# Patient Record
Sex: Female | Born: 1985 | Race: Asian | Hispanic: No | Marital: Single | Smoking: Never smoker
Health system: Southern US, Community
[De-identification: ages and names within clinical notes are randomized; demographics above are authoritative.]

## PROBLEM LIST (undated history)

## (undated) DIAGNOSIS — Z789 Other specified health status: Secondary | ICD-10-CM

## (undated) HISTORY — PX: NO PAST SURGERIES: SHX2092

---

## 2011-05-25 ENCOUNTER — Emergency Department (HOSPITAL_COMMUNITY)
Admission: EM | Admit: 2011-05-25 | Discharge: 2011-05-25 | Disposition: A | Discharge status: Home / Self Care | Payer: BC Managed Care – PPO | Attending: Emergency Medicine | Admitting: Emergency Medicine

## 2011-05-25 ENCOUNTER — Encounter (HOSPITAL_COMMUNITY): Payer: Self-pay | Admitting: *Deleted

## 2011-05-25 ENCOUNTER — Emergency Department (HOSPITAL_COMMUNITY): Payer: BC Managed Care – PPO

## 2011-05-25 DIAGNOSIS — W260XXA Contact with knife, initial encounter: Secondary | ICD-10-CM | POA: Insufficient documentation

## 2011-05-25 DIAGNOSIS — S61209A Unspecified open wound of unspecified finger without damage to nail, initial encounter: Secondary | ICD-10-CM | POA: Insufficient documentation

## 2011-05-25 DIAGNOSIS — S61219A Laceration without foreign body of unspecified finger without damage to nail, initial encounter: Secondary | ICD-10-CM

## 2011-05-25 MED ORDER — TETANUS-DIPHTH-ACELL PERTUSSIS 5-2.5-18.5 LF-MCG/0.5 IM SUSP
0.5000 mL | Freq: Once | INTRAMUSCULAR | Status: AC
  Administered 2011-05-25: 0.5 mL via INTRAMUSCULAR
  Filled 2011-05-25: qty 0.5

## 2011-05-25 MED ORDER — LIDOCAINE HCL 2 % IJ SOLN
10.0000 mL | Freq: Once | INTRAMUSCULAR | Status: AC
  Administered 2011-05-25: 200 mg

## 2011-05-25 MED ORDER — HYDROCODONE-ACETAMINOPHEN 5-325 MG PO TABS: 1.0000 | ORAL_TABLET | ORAL | Status: AC | PRN

## 2011-05-25 NOTE — Discharge Instructions (Signed)
Fingertip Laceration  The treatment of fingertip injuries depends on how large the cut is and whether the bone or nail tissue has been damaged. Amputations of the skin over the tip of the finger that is smaller than a dime (smaller than 1cm) will usually heal very well from the sides without any treatment other than cleaning the wound and changing the dressing.  Keep your hand elevated for the next 2 to 3 days to reduce pain and swelling. A splint over the fingertip may be needed to protect your injury. If your cut is being allowed to heal in from the sides, you should soak it in warm water and change the dressing daily.   You may need a tetanus shot if:   You cannot remember when you had your last tetanus shot.   You have never had a tetanus shot.   The injury broke your skin.  If you got a tetanus shot, your arm may swell, get red, and feel warm to the touch. This is common and not a problem. If you need a tetanus shot and you choose not to have one, there is a rare chance of getting tetanus. Sickness from tetanus can be serious.  SEEK MEDICAL CARE IF:    There are any signs of infection: increased redness, swelling, and pain, or sometimes pus drainage.  Document Released: 03/06/2004 Document Revised: 01/16/2011 Document Reviewed: 03/02/2008  ExitCare Patient Information 2012 [employer].

## 2011-05-25 NOTE — ED Notes (Signed)
Pt report cut the tip of left thumb with a knife an hour ago. Bleeding controlled. Cut through part of nailbed.  Pt denies history of recent tetanus shot.  Pt reports pain to be 5/10

## 2011-05-25 NOTE — ED Provider Notes (Signed)
History     CSN: [id number]  Arrival date & time 05/25/11  1925   First MD Initiated Contact with Patient 05/25/11 2008      Chief Complaint  Patient presents with  . Finger Injury  . Laceration    (Consider location/radiation/quality/duration/timing/severity/associated sxs/prior treatment) HPI  Pt presents to teh ED with complaints of left thumb laceration. She was cooking dinner and accidentally cut herself with a knife. Bleeding is controlled at this time. She is unsure of her tetanus status. She denies any other injuries or bleeding disorder.  History reviewed. No pertinent past medical history.  History reviewed. No pertinent past surgical history.  No family history on file.  History  Substance Use Topics  . Smoking status: Never Smoker   . Smokeless tobacco: Not on file  . Alcohol Use: No    OB History    Grav Para Term Preterm Abortions TAB SAB Ect Mult Living                  Review of Systems  All other systems reviewed and are negative.    Allergies  Review of patient's allergies indicates no known allergies.  Home Medications   Current Outpatient Rx  Name Route Sig Dispense Refill  . HYDROCODONE-ACETAMINOPHEN 5-325 MG PO TABS Oral Take 1 tablet by mouth every 4 (four) hours as needed for pain. 10 tablet 0    BP 105/65  Pulse 74  Temp(Src) 98.8 F (37.1 C) (Oral)  Resp 17  Ht 5\' 6"  (1.676 m)  Wt 136 lb 3.2 oz (61.78 kg)  BMI 21.98 kg/m2  SpO2 98%  LMP 05/05/2011  Physical Exam  Nursing note and vitals reviewed. Constitutional: She appears well-developed and well-nourished. No distress.  HENT:  Head: Normocephalic and atraumatic.  Eyes: Pupils are equal, round, and reactive to light.  Neck: Normal range of motion. Neck supple.  Cardiovascular: Normal rate and regular rhythm.   Pulmonary/Chest: Effort normal.  Abdominal: Soft.  Musculoskeletal:       Left hand: She exhibits tenderness and laceration. She exhibits normal range of  motion, no bony tenderness, normal two-point discrimination, normal capillary refill, no deformity and no swelling. normal sensation noted. Normal strength noted.       Hands: Neurological: She is alert.  Skin: Skin is warm and dry.    ED Course  Procedures (including critical care time)  Labs Reviewed - No data to display Dg Hand Complete Left  05/25/2011  *RADIOLOGY REPORT*  Clinical Data: Laceration to the tip of the left thumb from knife.  LEFT HAND - COMPLETE 3+ VIEW  Comparison: None.  Findings: There is no evidence of fracture or dislocation.  No radiopaque foreign bodies are seen.  The joint spaces are preserved; the known soft tissue laceration at the distal tip of the thumb is not well characterized.  The carpal rows are intact, and demonstrate normal alignment.  IMPRESSION: No evidence of fracture or dislocation.  No radiopaque foreign bodies seen.  Original Report Authenticated By: Tonia Ghent, M.D.     1. Finger laceration       MDM  LACERATION REPAIR Performed by: Dorthula Matas Authorized by: Dorthula Matas Consent: Verbal consent obtained. Risks and benefits: risks, benefits and alternatives were discussed Consent given by: patient Patient identity confirmed: provided demographic data Prepped and Draped in normal sterile fashion Wound explored  Laceration Location: right thumb dital nail bed  Laceration Length: 1cm  No Foreign Bodies seen or palpated  Anesthesia: digital block  Local anesthetic: lidocaine 2% wo epinephrine  Anesthetic total: 4 ml  Irrigation method: syringe Amount of cleaning: standard  Skin closure: sutures  Number of sutures: 3  Technique: simple interrupted.  Patient tolerance: Patient tolerated the procedure well with no immediate complications.    Pt to have follow-up with Dr. Mina Marble.Sutures are to be removed in 7 days. Instructions on wound care given.  Pt has been advised of the symptoms that warrant their  return to the ED. Patient has voiced understanding and has agreed to follow-up with the PCP or specialist.      Dorthula Matas, PA 05/25/11 2145

## 2011-05-30 NOTE — ED Provider Notes (Signed)
Medical screening examination/treatment/procedure(s) were performed by non-physician practitioner and as supervising physician I was immediately available for consultation/collaboration.  Tashanna Dolin, MD 05/30/11 0940 

## 2014-08-31 LAB — OB RESULTS CONSOLE RUBELLA ANTIBODY, IGM: RUBELLA: IMMUNE

## 2014-08-31 LAB — OB RESULTS CONSOLE HEPATITIS B SURFACE ANTIGEN: HEP B S AG: NEGATIVE

## 2014-08-31 LAB — OB RESULTS CONSOLE RPR: RPR: NONREACTIVE

## 2014-08-31 LAB — OB RESULTS CONSOLE ABO/RH: RH TYPE: POSITIVE

## 2014-08-31 LAB — OB RESULTS CONSOLE HIV ANTIBODY (ROUTINE TESTING): HIV: NONREACTIVE

## 2014-09-11 ENCOUNTER — Other Ambulatory Visit (HOSPITAL_COMMUNITY): Payer: Self-pay | Admitting: Nurse Practitioner

## 2014-09-11 DIAGNOSIS — Z3A13 13 weeks gestation of pregnancy: Secondary | ICD-10-CM

## 2014-09-11 DIAGNOSIS — Z3682 Encounter for antenatal screening for nuchal translucency: Secondary | ICD-10-CM

## 2014-09-12 ENCOUNTER — Ambulatory Visit (HOSPITAL_COMMUNITY)
Admission: RE | Admit: 2014-09-12 | Discharge: 2014-09-12 | Disposition: A | Discharge status: Home / Self Care | Payer: Medicaid Other | Source: Ambulatory Visit | Attending: Nurse Practitioner | Admitting: Nurse Practitioner

## 2014-09-12 ENCOUNTER — Encounter (HOSPITAL_COMMUNITY): Payer: Self-pay

## 2014-09-12 DIAGNOSIS — Z3682 Encounter for antenatal screening for nuchal translucency: Secondary | ICD-10-CM

## 2014-09-12 DIAGNOSIS — Z3A13 13 weeks gestation of pregnancy: Secondary | ICD-10-CM | POA: Insufficient documentation

## 2014-09-12 DIAGNOSIS — Z36 Encounter for antenatal screening of mother: Secondary | ICD-10-CM | POA: Insufficient documentation

## 2014-09-12 HISTORY — DX: Other specified health status: Z78.9

## 2014-09-22 ENCOUNTER — Other Ambulatory Visit (HOSPITAL_COMMUNITY): Payer: Self-pay | Admitting: Nurse Practitioner

## 2014-09-28 ENCOUNTER — Other Ambulatory Visit (HOSPITAL_COMMUNITY): Payer: Self-pay | Admitting: Urology

## 2014-09-28 DIAGNOSIS — Z3689 Encounter for other specified antenatal screening: Secondary | ICD-10-CM

## 2014-09-28 DIAGNOSIS — Z3A18 18 weeks gestation of pregnancy: Secondary | ICD-10-CM

## 2014-10-19 ENCOUNTER — Ambulatory Visit (HOSPITAL_COMMUNITY)
Admission: RE | Admit: 2014-10-19 | Discharge: 2014-10-19 | Disposition: A | Discharge status: Home / Self Care | Payer: Medicaid Other | Source: Ambulatory Visit | Attending: Physician Assistant | Admitting: Physician Assistant

## 2014-10-19 DIAGNOSIS — Z36 Encounter for antenatal screening of mother: Secondary | ICD-10-CM | POA: Insufficient documentation

## 2014-10-19 DIAGNOSIS — Z3689 Encounter for other specified antenatal screening: Secondary | ICD-10-CM

## 2014-10-19 DIAGNOSIS — Z3A18 18 weeks gestation of pregnancy: Secondary | ICD-10-CM | POA: Diagnosis not present

## 2014-10-30 ENCOUNTER — Other Ambulatory Visit (HOSPITAL_COMMUNITY): Payer: Self-pay | Admitting: Physician Assistant

## 2014-10-30 DIAGNOSIS — Z0489 Encounter for examination and observation for other specified reasons: Secondary | ICD-10-CM

## 2014-10-30 DIAGNOSIS — IMO0002 Reserved for concepts with insufficient information to code with codable children: Secondary | ICD-10-CM

## 2014-11-08 ENCOUNTER — Other Ambulatory Visit (HOSPITAL_COMMUNITY): Payer: Self-pay | Admitting: Physician Assistant

## 2014-11-08 DIAGNOSIS — IMO0002 Reserved for concepts with insufficient information to code with codable children: Secondary | ICD-10-CM

## 2014-11-08 DIAGNOSIS — Z3A21 21 weeks gestation of pregnancy: Secondary | ICD-10-CM

## 2014-11-08 DIAGNOSIS — Z0489 Encounter for examination and observation for other specified reasons: Secondary | ICD-10-CM

## 2014-11-09 ENCOUNTER — Ambulatory Visit (HOSPITAL_COMMUNITY)
Admission: RE | Admit: 2014-11-09 | Discharge: 2014-11-09 | Disposition: A | Discharge status: Home / Self Care | Payer: Medicaid Other | Source: Ambulatory Visit | Attending: Physician Assistant | Admitting: Physician Assistant

## 2014-11-09 DIAGNOSIS — Z3A21 21 weeks gestation of pregnancy: Secondary | ICD-10-CM | POA: Insufficient documentation

## 2014-11-09 DIAGNOSIS — IMO0002 Reserved for concepts with insufficient information to code with codable children: Secondary | ICD-10-CM

## 2014-11-09 DIAGNOSIS — Z36 Encounter for antenatal screening of mother: Secondary | ICD-10-CM | POA: Diagnosis present

## 2014-11-09 DIAGNOSIS — Z0489 Encounter for examination and observation for other specified reasons: Secondary | ICD-10-CM

## 2015-02-11 NOTE — L&D Delivery Note (Signed)
Delivery Note Pt pushed well for 2 hrs 45 mins and at 5:25 AM a viable female was delivered via  (Presentation: Left Occiput Anterior).  APGAR: 8, 9; weight: pending .   Placenta status: Intact, Spontaneous.  Cord: 3 vessels   Anesthesia: Epidural  Episiotomy: None Lacerations:  Partial 3rd degree- repaired by Dr Despina Hidden Suture Repair: 3.0 monocryl Est. Blood Loss (mL):  300  Mom to postpartum.  Baby to Couplet care / Skin to Skin.  Cam Hai CNM 03/17/2015, 5:56 AM

## 2015-02-23 LAB — OB RESULTS CONSOLE GBS: GBS: NEGATIVE

## 2015-02-23 LAB — OB RESULTS CONSOLE GC/CHLAMYDIA
Chlamydia: NEGATIVE
Gonorrhea: NEGATIVE

## 2015-03-16 ENCOUNTER — Inpatient Hospital Stay (HOSPITAL_COMMUNITY): Payer: Medicaid Other | Admitting: Anesthesiology

## 2015-03-16 ENCOUNTER — Encounter (HOSPITAL_COMMUNITY): Payer: Self-pay | Admitting: Anesthesiology

## 2015-03-16 ENCOUNTER — Encounter (HOSPITAL_COMMUNITY): Payer: Self-pay | Admitting: *Deleted

## 2015-03-16 ENCOUNTER — Inpatient Hospital Stay (HOSPITAL_COMMUNITY)
Admission: AD | Admit: 2015-03-16 | Discharge: 2015-03-19 | DRG: 775 | Disposition: A | Discharge status: Home / Self Care | Payer: Medicaid Other | Source: Ambulatory Visit | Attending: Family Medicine | Admitting: Family Medicine

## 2015-03-16 DIAGNOSIS — O4202 Full-term premature rupture of membranes, onset of labor within 24 hours of rupture: Principal | ICD-10-CM | POA: Diagnosis present

## 2015-03-16 DIAGNOSIS — IMO0001 Reserved for inherently not codable concepts without codable children: Secondary | ICD-10-CM

## 2015-03-16 DIAGNOSIS — Z3A39 39 weeks gestation of pregnancy: Secondary | ICD-10-CM

## 2015-03-16 DIAGNOSIS — O4212 Full-term premature rupture of membranes, onset of labor more than 24 hours following rupture: Secondary | ICD-10-CM | POA: Diagnosis not present

## 2015-03-16 DIAGNOSIS — O4290 Premature rupture of membranes, unspecified as to length of time between rupture and onset of labor, unspecified weeks of gestation: Secondary | ICD-10-CM | POA: Diagnosis present

## 2015-03-16 LAB — TYPE AND SCREEN
ABO/RH(D): B POS
Antibody Screen: NEGATIVE

## 2015-03-16 LAB — CBC
HCT: 36.1 % (ref 36.0–46.0)
HEMOGLOBIN: 12.3 g/dL (ref 12.0–15.0)
MCH: 30.9 pg (ref 26.0–34.0)
MCHC: 34.1 g/dL (ref 30.0–36.0)
MCV: 90.7 fL (ref 78.0–100.0)
PLATELETS: 166 10*3/uL (ref 150–400)
RBC: 3.98 MIL/uL (ref 3.87–5.11)
RDW: 13.8 % (ref 11.5–15.5)
WBC: 9.8 10*3/uL (ref 4.0–10.5)

## 2015-03-16 LAB — SYPHILIS: RPR W/REFLEX TO RPR TITER AND TREPONEMAL ANTIBODIES, TRADITIONAL SCREENING AND DIAGNOSIS ALGORITHM: RPR Ser Ql: NONREACTIVE

## 2015-03-16 LAB — ABO/RH: ABO/RH(D): B POS

## 2015-03-16 MED ORDER — FENTANYL CITRATE (PF) 100 MCG/2ML IJ SOLN: 50.0000 ug | INTRAMUSCULAR | Status: DC | PRN

## 2015-03-16 MED ORDER — DIPHENHYDRAMINE HCL 50 MG/ML IJ SOLN
12.5000 mg | INTRAMUSCULAR | Status: DC | PRN
Start: 2015-03-16 — End: 2015-03-17

## 2015-03-16 MED ORDER — LIDOCAINE HCL (PF) 1 % IJ SOLN
INTRAMUSCULAR | Status: DC | PRN
  Administered 2015-03-16: 2 mL via EPIDURAL
  Administered 2015-03-16: 3 mL via EPIDURAL
  Administered 2015-03-16: 5 mL via EPIDURAL

## 2015-03-16 MED ORDER — EPHEDRINE 5 MG/ML INJ: 10.0000 mg | INTRAVENOUS | Status: DC | PRN

## 2015-03-16 MED ORDER — LACTATED RINGERS IV SOLN
INTRAVENOUS | Status: DC
  Administered 2015-03-16 – 2015-03-17 (×4): via INTRAVENOUS

## 2015-03-16 MED ORDER — ONDANSETRON HCL 4 MG/2ML IJ SOLN: 4.0000 mg | Freq: Four times a day (QID) | INTRAMUSCULAR | Status: DC | PRN

## 2015-03-16 MED ORDER — MISOPROSTOL 50MCG HALF TABLET
50.0000 ug | ORAL_TABLET | ORAL | Status: DC
  Administered 2015-03-16: 50 ug via ORAL
  Filled 2015-03-16: qty 0.5

## 2015-03-16 MED ORDER — FENTANYL 2.5 MCG/ML BUPIVACAINE 1/10 % EPIDURAL INFUSION (WH - ANES)
14.0000 mL/h | INTRAMUSCULAR | Status: DC | PRN
  Administered 2015-03-16: 14 mL/h via EPIDURAL
  Administered 2015-03-17: 5 mL/h via EPIDURAL
  Filled 2015-03-16 (×2): qty 125

## 2015-03-16 MED ORDER — OXYTOCIN 10 UNIT/ML IJ SOLN
1.0000 m[IU]/min | INTRAVENOUS | Status: DC
  Administered 2015-03-16: 2 m[IU]/min via INTRAVENOUS
  Administered 2015-03-17: 10 m[IU]/min via INTRAVENOUS
  Administered 2015-03-17: 8 m[IU]/min via INTRAVENOUS

## 2015-03-16 MED ORDER — OXYTOCIN BOLUS FROM INFUSION
500.0000 mL | INTRAVENOUS | Status: DC
  Administered 2015-03-17: 500 mL via INTRAVENOUS

## 2015-03-16 MED ORDER — ACETAMINOPHEN 325 MG PO TABS: 650.0000 mg | ORAL_TABLET | ORAL | Status: DC | PRN

## 2015-03-16 MED ORDER — OXYTOCIN 10 UNIT/ML IJ SOLN
2.5000 [IU]/h | INTRAVENOUS | Status: DC
  Filled 2015-03-16: qty 4

## 2015-03-16 MED ORDER — PHENYLEPHRINE 40 MCG/ML (10ML) SYRINGE FOR IV PUSH (FOR BLOOD PRESSURE SUPPORT)
80.0000 ug | PREFILLED_SYRINGE | INTRAVENOUS | Status: DC | PRN
  Filled 2015-03-16: qty 20

## 2015-03-16 MED ORDER — OXYCODONE-ACETAMINOPHEN 5-325 MG PO TABS: 2.0000 | ORAL_TABLET | ORAL | Status: DC | PRN

## 2015-03-16 MED ORDER — CITRIC ACID-SODIUM CITRATE 334-500 MG/5ML PO SOLN
30.0000 mL | ORAL | Status: DC | PRN
Start: 2015-03-16 — End: 2015-03-17

## 2015-03-16 MED ORDER — LACTATED RINGERS IV SOLN
500.0000 mL | INTRAVENOUS | Status: DC | PRN
  Administered 2015-03-16: 500 mL via INTRAVENOUS
  Administered 2015-03-16: 1000 mL via INTRAVENOUS
  Administered 2015-03-17 (×2): 500 mL via INTRAVENOUS

## 2015-03-16 MED ORDER — LIDOCAINE HCL (PF) 1 % IJ SOLN
30.0000 mL | INTRAMUSCULAR | Status: DC | PRN
  Administered 2015-03-17: 30 mL via SUBCUTANEOUS
  Filled 2015-03-16: qty 30

## 2015-03-16 MED ORDER — FLEET ENEMA 7-19 GM/118ML RE ENEM: 1.0000 | ENEMA | RECTAL | Status: DC | PRN

## 2015-03-16 MED ORDER — FENTANYL CITRATE (PF) 100 MCG/2ML IJ SOLN
50.0000 ug | INTRAMUSCULAR | Status: DC | PRN
  Administered 2015-03-16 (×2): 100 ug via INTRAVENOUS
  Filled 2015-03-16 (×2): qty 2

## 2015-03-16 MED ORDER — OXYCODONE-ACETAMINOPHEN 5-325 MG PO TABS: 1.0000 | ORAL_TABLET | ORAL | Status: DC | PRN

## 2015-03-16 MED ORDER — TERBUTALINE SULFATE 1 MG/ML IJ SOLN: 0.2500 mg | Freq: Once | INTRAMUSCULAR | Status: DC | PRN

## 2015-03-16 NOTE — Progress Notes (Addendum)
Gloria Mills is a 30 y.o. G1P0 at [redacted]w[redacted]d admitted for augmentation of labor due to PROM.  Subjective: Pt breathing with contractions, reports IV Fentanyl is helping.  Family in room for support.  Objective: BP 107/72 mmHg  Pulse 75  Temp(Src) 98.3 F (36.8 C) (Oral)  Resp 20  Ht  (1.626 m)  Wt 76.658 kg (169 lb)  BMI 28.99 kg/m2  LMP 06/11/2014      FHT:  FHR: 130 bpm, variability: moderate,  accelerations:  Present,  decelerations:  Present subtle variables, down to 110 lasting 20-25 sec with contractions UC:   regular, every 3-5 minutes SVE:   Dilation: 6.5 Effacement (%): 80 Station: -2 Exam by:: leftwich-kirby, cnm  Labs: Lab Results  Component Value Date   WBC 9.8 03/16/2015   HGB 12.3 03/16/2015   HCT 36.1 03/16/2015   MCV 90.7 03/16/2015   PLT 166 03/16/2015    Assessment / Plan: Augmentation of labor, progressing well  Labor: Progressing normally and plan to hold Pitocin at this time, reevaluate in 2 hours Preeclampsia:  n/a Fetal Wellbeing:  Category II and overall Category I Pain Control:  Fentanyl I/D:  n/a Anticipated MOD:  NSVD  LEFTWICH-KIRBY, Gloria Mills 03/16/2015, 5:24 PM

## 2015-03-16 NOTE — Anesthesia Preprocedure Evaluation (Signed)
Anesthesia Evaluation  Patient identified by MRN, date of birth, ID band  Reviewed: Allergy & Precautions, NPO status , Patient's Chart, lab work & pertinent test results  Airway Mallampati: II  TM Distance: >3 FB     Dental   Pulmonary neg pulmonary ROS,    breath sounds clear to auscultation       Cardiovascular negative cardio ROS   Rhythm:Regular Rate:Normal     Neuro/Psych negative neurological ROS     GI/Hepatic negative GI ROS, Neg liver ROS,   Endo/Other  negative endocrine ROS  Renal/Burgen negative Renal ROS     Musculoskeletal   Abdominal   Peds  Hematology negative hematology ROS (+)   Anesthesia Other Findings   Reproductive/Obstetrics (+) Pregnancy                             Lab Results  Component Value Date   WBC 9.8 03/16/2015   HGB 12.3 03/16/2015   HCT 36.1 03/16/2015   MCV 90.7 03/16/2015   PLT 166 03/16/2015    Anesthesia Physical Anesthesia Plan  ASA: II  Anesthesia Plan: Epidural   Post-op Pain Management:    Induction:   Airway Management Planned:   Additional Equipment:   Intra-op Plan:   Post-operative Plan:   Informed Consent: I have reviewed the patients History and Physical, chart, labs and discussed the procedure including the risks, benefits and alternatives for the proposed anesthesia with the patient or authorized representative who has indicated his/her understanding and acceptance.     Plan Discussed with:   Anesthesia Plan Comments:         Anesthesia Quick Evaluation

## 2015-03-16 NOTE — Progress Notes (Signed)
Labor Progress Note  Gloria Mills is a 30 y.o. G1P0 at [redacted]w[redacted]d  admitted for PROM  S:  Patient feeling contractions more  O:  BP 108/67 mmHg  Pulse 77  Temp(Src) 98.5 F (36.9 C) (Oral)  Resp 20  Ht  (1.626 m)  Wt 76.658 kg (169 lb)  BMI 28.99 kg/m2  LMP 06/11/2014     FHT:  FHR: 125-130 bpm, variability: moderate,  accelerations:  Present,  decelerations:  Present variable UC: 1-6 SVE:   Dilation: 4 Effacement (%): 50 Station: -3 Exam by:: leftwich-kirby, cnm PROM   Labs: Lab Results  Component Value Date   WBC 9.8 03/16/2015   HGB 12.3 03/16/2015   HCT 36.1 03/16/2015   MCV 90.7 03/16/2015   PLT 166 03/16/2015    Assessment / Plan: 30 y.o. G1P0 [redacted]w[redacted]d early labor. FB fell out ~4:30PM  Labor: Progressing normally; will check cervix in 45 mins and determine if good candidate for Pitocin Fetal Wellbeing:  Category II; 2 variable decels otherwise reassuring and reactive Pain Control:  Fentanyl and epidural  per request Anticipated MOD:  NSVD  Expectant management  Palma Holter, MD

## 2015-03-16 NOTE — Progress Notes (Signed)
CRNA notified me that pt ready for epidural at this time... Bolus started and anesthesiologist notified.

## 2015-03-16 NOTE — Progress Notes (Signed)
Labor Progress Note  Gloria Mills is a 30 y.o. G1P0 at [redacted]w[redacted]d  admitted for PROM  S:  Doing well. No concerns or questions. Only having irregular contractions. Currently comfortable but would eventually would like Epidural.   O:  BP 108/66 mmHg  Pulse 79  Temp(Src) 98.1 F (36.7 C) (Oral)  Resp 18  Ht  (1.626 m)  Wt 76.658 kg (169 lb)  BMI 28.99 kg/m2  LMP 06/11/2014     FHT:  FHR: 135 bpm, variability: moderate,  accelerations:  Present,  decelerations:  Present 2 variable decels over 20 mins UC:  None noted over 20 mins SVE:   Dilation: 1.5 Effacement (%): 50 Station: -3 Exam by:: B Boyer RN  PROM   Labs: Lab Results  Component Value Date   WBC 9.8 03/16/2015   HGB 12.3 03/16/2015   HCT 36.1 03/16/2015   MCV 90.7 03/16/2015   PLT 166 03/16/2015    Assessment / Plan: 30 y.o. G1P0 [redacted]w[redacted]d PROM 0030 2/3. IOL with cytotec with first dose at 7am.   Labor: IOL with cytoterc with first dose at 7AM; will recheck in about 2 hours to determine if s Fetal Wellbeing:  Category II: 2 variable decels over 20 mins otherwise good baseline, mod variability, and accels  Pain Control:  patient desires eventual epidural  Anticipated MOD:  NSVD  Expectant management  Palma Holter, MD PGY 1 Family Medicine

## 2015-03-16 NOTE — Progress Notes (Signed)
Ctx are infrequent.  FHR 120's, avg variability, + accels, no decels.  Will start PO cytotec

## 2015-03-16 NOTE — Anesthesia Procedure Notes (Signed)
Epidural Patient location during procedure: OB  Staffing Anesthesiologist: Marcene Duos Performed by: anesthesiologist   Preanesthetic Checklist Completed: patient identified, site marked, surgical consent, pre-op evaluation, timeout performed, IV checked, risks and benefits discussed and monitors and equipment checked  Epidural Patient position: sitting Prep: site prepped and draped and DuraPrep Patient monitoring: continuous pulse ox and blood pressure Approach: midline Location: L4-L5 Injection technique: LOR air  Needle:  Needle type: Tuohy  Needle gauge: 17 G Needle length: 9 cm and 9 Needle insertion depth: 6 cm Catheter type: closed end flexible Catheter size: 19 Gauge Catheter at skin depth: 11 cm Test dose: negative  Assessment Events: blood not aspirated, injection not painful, no injection resistance, negative IV test and no paresthesia

## 2015-03-16 NOTE — Progress Notes (Signed)
Gloria Mills is a 30 y.o. G1P0 at [redacted]w[redacted]d admitted for PROM  Subjective: Pt comfortable with epidural, family in room for support.  Objective: BP 93/55 mmHg  Pulse 92  Temp(Src) 99.7 F (37.6 C) (Oral)  Resp 15  Ht  (1.626 m)  Wt 76.658 kg (169 lb)  BMI 28.99 kg/m2  LMP 06/11/2014      FHT:  FHR: 130 bpm, variability: moderate,  accelerations:  Present,  decelerations:  Absent UC:   regular, every 2-3 minutes SVE:   Dilation: 7 Effacement (%): 80 (more to pt's right) Station: -2 Exam by:: Leftwich-Kirby, CNM  Labs: Lab Results  Component Value Date   WBC 9.8 03/16/2015   HGB 12.3 03/16/2015   HCT 36.1 03/16/2015   MCV 90.7 03/16/2015   PLT 166 03/16/2015    Assessment / Plan: Augmentation of labor, progressing well  Labor: Progressing normally Preeclampsia:  n/a Fetal Wellbeing:  Category I Pain Control:  Epidural I/D:  n/a Anticipated MOD:  NSVD  LEFTWICH-KIRBY, Margerite Impastato 03/16/2015, 10:34 PM

## 2015-03-16 NOTE — Progress Notes (Cosign Needed)
Labor Progress Note  Arlisha Patalano is a 30 y.o. G1P0 at [redacted]w[redacted]d  admitted for PROM  S:  Patient doing well no concerns. Contractions about 2-4 mins; not painful.   O:  BP 100/67 mmHg  Pulse 89  Temp(Src) 98.3 F (36.8 C) (Oral)  Resp 20  Ht  (1.626 m)  Wt 76.658 kg (169 lb)  BMI 28.99 kg/m2  LMP 06/11/2014     FHT:  FHR: 130 bpm, variability: moderate,  accelerations:  Abscent,  decelerations:  Absent UC:   Every 4-6 mins SVE:   Dilation: 1.5 Effacement (%): 50 Station: -3 Exam by:: B Boyer RN  PROM 0030 2/3   Labs: Lab Results  Component Value Date   WBC 9.8 03/16/2015   HGB 12.3 03/16/2015   HCT 36.1 03/16/2015   MCV 90.7 03/16/2015   PLT 166 03/16/2015    Assessment / Plan: 30 y.o. G1P0 [redacted]w[redacted]d IOL, cervix unchanged from 4 hrs (after cytotec).  placed FB 1130  Labor: FB placed Fetal Wellbeing:  Category I Pain Control:  Fentanyl PRN for now; eventual epidural Anticipated MOD:  NSVD   Palma Holter, MD PGY 1 Family Medicine

## 2015-03-16 NOTE — MAU Note (Signed)
PT  SAYS SROM  AT 0030-  WAS  SLEEPING-- WOKE  WITH  FLUID  COMING  OUT.    PNC-   HD.   VE  AT  HD  ON WED     CLOSED.   GBS- NEG

## 2015-03-16 NOTE — H&P (Signed)
Gloria Mills is a 30 y.o. female presenting for PROM at 63.  Having ctx q 2-4 minutes, not painful. History OB History    Gravida Para Term Preterm AB TAB SAB Ectopic Multiple Living   1              Past Medical History  Diagnosis Date  . Medical history non-contributory    Past Surgical History  Procedure Laterality Date  . No past surgeries     Social History: Social History   Social History  . Marital Status: Single    Spouse Name: N/A  . Number of Children: N/A  . Years of Education: N/A   Social History Main Topics  . Smoking status: Never Smoker   . Smokeless tobacco: None  . Alcohol Use: No  . Drug Use: No  . Sexual Activity: Not Asked   Other Topics Concern  . None   Social History Narrative    Family History: History reviewed. No pertinent family history.  Allergies: No Known Allergies  Prescriptions prior to admission  Medication Sig Dispense Refill Last Dose  . Prenatal Vit-Fe Fumarate-FA (PRENATAL VITAMIN PO) Take by mouth.   03/15/2015 at Unknown time      Review of Systems   Constitutional: Negative for fever and chills Eyes: Negative for visual disturbances Respiratory: Negative for shortness of breath, dyspnea Cardiovascular: Negative for chest pain or palpitations  Gastrointestinal: Negative for vomiting, diarrhea and constipation.  POSITIVE for abdominal pain (contractions) Genitourinary: Negative for dysuria and urgency Musculoskeletal: Negative for back pain, joint pain, myalgias  Neurological: Negative for dizziness and headaches      Blood pressure 113/66, pulse 94, temperature 98.3 F (36.8 C), temperature source Oral, resp. rate 18, height  (1.626 m), weight 76.658 kg (169 lb), last menstrual period 06/11/2014. General appearance: alert, cooperative and no distress Lungs: clear to auscultation bilaterally Heart: regular rate and rhythm Abdomen: soft, non-tender; bowel sounds normal Pelvic: LTC, high per RN. Vtx confrimed  by [place] (Dr Alvester Morin) Extremities: Homans sign is negative, no sign of DVT DTR's 2+ Presentation: cephalic Fetal monitoring  Baseline: 140 bpm, Variability: Good {> 6 bpm), Accelerations: Reactive and Decelerations: Absent Uterine activity  2-3 minutes  Exam by:: DR NEWTON    No results found for this or any previous visit (from the past 24 hour(s)).  Assessment: Gloria Mills is a 30 y.o. G1P0 with an IUP at [redacted]w[redacted]d presenting for PROM  Plan: #Labor: expectant management #Pain:  Per request #FWB Cat 1 #ID: GBS: neg  #MOF:  breast    Gloria Mills,Gloria Mills 03/16/2015, 2:26 AM    Prenatal Transfer Tool  Maternal Diabetes: No Genetic Screening: Normal Maternal Ultrasounds/Referrals: Normal Fetal Ultrasounds or other Referrals:  None Maternal Substance Abuse:  No Significant Maternal Medications:  None Significant Maternal Lab Results:  None Other Comments:  None  ROS  Exam by:: DR NEWTON Blood pressure 113/66, pulse 94, temperature 98.3 F (36.8 C), temperature source Oral, resp. rate 18, height  (1.626 m), weight 76.658 kg (169 lb), last menstrual period 06/11/2014. Exam Physical Exam  Prenatal labs: ABO, Rh:  A+ Antibody:  neg Rubella:  immune RPR:   neg HBsAg:   neg HIV:   neg GBS:   neg  Assessment/Plan: Expectant management for now.. If not laboring in several hours, will augment.    Gloria Mills,Gloria Mills 03/16/2015, 2:24 AM

## 2015-03-17 ENCOUNTER — Other Ambulatory Visit: Payer: Self-pay | Admitting: Obstetrics & Gynecology

## 2015-03-17 ENCOUNTER — Encounter (HOSPITAL_COMMUNITY): Payer: Self-pay

## 2015-03-17 DIAGNOSIS — O4212 Full-term premature rupture of membranes, onset of labor more than 24 hours following rupture: Secondary | ICD-10-CM

## 2015-03-17 DIAGNOSIS — Z3A39 39 weeks gestation of pregnancy: Secondary | ICD-10-CM

## 2015-03-17 MED ORDER — BENZOCAINE-MENTHOL 20-0.5 % EX AERO
1.0000 "application " | INHALATION_SPRAY | CUTANEOUS | Status: DC | PRN
  Administered 2015-03-17: 1 via TOPICAL
  Filled 2015-03-17 (×2): qty 56

## 2015-03-17 MED ORDER — DIBUCAINE 1 % RE OINT
1.0000 "application " | TOPICAL_OINTMENT | RECTAL | Status: DC | PRN
  Filled 2015-03-17: qty 28

## 2015-03-17 MED ORDER — ACETAMINOPHEN 325 MG PO TABS: 650.0000 mg | ORAL_TABLET | ORAL | Status: DC | PRN

## 2015-03-17 MED ORDER — LANOLIN HYDROUS EX OINT: TOPICAL_OINTMENT | CUTANEOUS | Status: DC | PRN

## 2015-03-17 MED ORDER — DIPHENHYDRAMINE HCL 25 MG PO CAPS: 25.0000 mg | ORAL_CAPSULE | Freq: Four times a day (QID) | ORAL | Status: DC | PRN

## 2015-03-17 MED ORDER — SIMETHICONE 80 MG PO CHEW: 80.0000 mg | CHEWABLE_TABLET | ORAL | Status: DC | PRN

## 2015-03-17 MED ORDER — WITCH HAZEL-GLYCERIN EX PADS
1.0000 "application " | MEDICATED_PAD | CUTANEOUS | Status: DC | PRN
  Administered 2015-03-17: 1 via TOPICAL

## 2015-03-17 MED ORDER — ZOLPIDEM TARTRATE 5 MG PO TABS: 5.0000 mg | ORAL_TABLET | Freq: Every evening | ORAL | Status: DC | PRN

## 2015-03-17 MED ORDER — TETANUS-DIPHTH-ACELL PERTUSSIS 5-2.5-18.5 LF-MCG/0.5 IM SUSP: 0.5000 mL | Freq: Once | INTRAMUSCULAR | Status: DC

## 2015-03-17 MED ORDER — SENNOSIDES-DOCUSATE SODIUM 8.6-50 MG PO TABS
2.0000 | ORAL_TABLET | ORAL | Status: DC
  Administered 2015-03-17 – 2015-03-18 (×2): 2 via ORAL
  Filled 2015-03-17 (×2): qty 2

## 2015-03-17 MED ORDER — ONDANSETRON HCL 4 MG PO TABS: 4.0000 mg | ORAL_TABLET | ORAL | Status: DC | PRN

## 2015-03-17 MED ORDER — IBUPROFEN 600 MG PO TABS
600.0000 mg | ORAL_TABLET | Freq: Four times a day (QID) | ORAL | Status: DC
  Administered 2015-03-17 – 2015-03-19 (×9): 600 mg via ORAL
  Filled 2015-03-17 (×9): qty 1

## 2015-03-17 MED ORDER — PRENATAL MULTIVITAMIN CH
1.0000 | ORAL_TABLET | Freq: Every day | ORAL | Status: DC
  Administered 2015-03-17 – 2015-03-18 (×2): 1 via ORAL
  Filled 2015-03-17 (×2): qty 1

## 2015-03-17 MED ORDER — ONDANSETRON HCL 4 MG/2ML IJ SOLN: 4.0000 mg | INTRAMUSCULAR | Status: DC | PRN

## 2015-03-17 NOTE — Progress Notes (Signed)
Patient ID: Gloria Mills, female   DOB: 07-Jun-1985, 30 y.o.   MRN: [id number]  LABOR PROGRESS NOTE  Gloria Mills is a 30 y.o. G1P0 at [redacted]w[redacted]d  admitted for PROM @ 00:45 on 03/16/15.  Subjective: Pt reports being tired. Family in room for support.  Objective: BP 86/51 mmHg  Pulse 85  Temp(Src) 98.6 F (37 C) (Oral)  Resp 15  Ht  (1.626 m)  Wt 76.658 kg (169 lb)  BMI 28.99 kg/m2  LMP 06/11/2014 or  Filed Vitals:   03/17/15 0001 03/17/15 0031 03/17/15 0101 03/17/15 0131  BP: 109/72 115/69 100/79 86/51  Pulse: 101 106 110 85  Temp:      TempSrc:      Resp:  15    Height:      Weight:       FHR: 150 bpm Variability: moderate Accels: present Decels: frequent early, occasional variable, late decel x1  General: Appears comfortable reclining in bed Dilation: Lip/rim Dilation Complete Date: 03/16/15 Dilation Complete Time: 2352 Effacement (%): 90 Cervical Position: Anterior Station: 0 Presentation: Vertex Exam by:: Benedetto Goad, RN  Labs: Lab Results  Component Value Date   WBC 9.8 03/16/2015   HGB 12.3 03/16/2015   HCT 36.1 03/16/2015   MCV 90.7 03/16/2015   PLT 166 03/16/2015    Patient Active Problem List   Diagnosis Date Noted  . PROM (premature rupture of membranes) 03/16/2015  . [redacted] weeks gestation of pregnancy   . Encounter for (NT) nuchal translucency scan     Assessment / Plan: 30 y.o. G1P0 at [redacted]w[redacted]d here for PROM s/p cytotec, foley bulb, and pitocin.  Labor: progressing well, not yet ready to push, prolonged PROM Fetal Wellbeing:  Category I-II Pain Control:  epidural Anticipated MOD:  NSVD  Ann Maki, MS3 03/17/2015, 1:42 AM  I have seen and examined this patient and I agree with the above. Cam Hai 8:51 AM 03/22/2015

## 2015-03-17 NOTE — Lactation Note (Signed)
This note was copied from the chart of Gloria Mills. Lactation Consultation Note  Patient Name: Gloria Jazzy Parmer [id number] Date: 03/17/2015 Reason for consult: Initial assessment Basic teaching reviewed with Mom. Assisted with positioning and obtaining deep latch. Baby demonstrated some good suckling bursts with swallows noted.  Encouraged to BF with feeding ques, STS. Lactation brochure left for review, advised of OP services and support group. Encouraged to call for assist as needed.   Maternal Data Has patient been taught Hand Expression?: Yes Does the patient have breastfeeding experience prior to this delivery?: No  Feeding Feeding Type: Breast Fed  LATCH Score/Interventions Latch: Grasps breast easily, tongue down, lips flanged, rhythmical sucking. Intervention(s): Adjust position;Assist with latch;Breast massage;Breast compression  Audible Swallowing: A few with stimulation  Type of Nipple: Everted at rest and after stimulation  Comfort (Breast/Nipple): Soft / non-tender     Hold (Positioning): Assistance needed to correctly position infant at breast and maintain latch. Intervention(s): Breastfeeding basics reviewed;Support Pillows;Position options;Skin to skin  LATCH Score: 8  Lactation Tools Discussed/Used WIC Program: Yes   Consult Status Consult Status: Follow-up Date: 03/18/15 Follow-up type: In-patient    Alfred Levins 03/17/2015, 6:10 PM

## 2015-03-17 NOTE — Progress Notes (Signed)
Gloria Mills is a 30 y.o. G1P0 at [redacted]w[redacted]d admitted for rupture of membranes  Subjective: Pt thought to be complete at midnight, but cx was fully dilated @ 0240. Pushing will almost two hours now in various positions.   Objective: BP 107/46 mmHg  Pulse 103  Temp(Src) 98.8 F (37.1 C) (Oral)  Resp 16  Ht  (1.626 m)  Wt 76.658 kg (169 lb)  BMI 28.99 kg/m2  LMP 06/11/2014      FHT:  FHR: 150s bpm, variability: moderate,  accelerations:  Present,  decelerations:  Absent- early variables w/ pushing UC:   regular, every 2-3 minutes w/ Pit @ 92mu/min SVE:   Dilation: 10 Effacement (%): 100 Station: +1 Exam by:: Lorin Mercy, RN  Labs: Lab Results  Component Value Date   WBC 9.8 03/16/2015   HGB 12.3 03/16/2015   HCT 36.1 03/16/2015   MCV 90.7 03/16/2015   PLT 166 03/16/2015    Assessment / Plan: IUP@term  Pushing ROM x 28hrs- afebrile  Continue pushing  Benedicta Sultan CNM 03/17/2015, 4:35 AM

## 2015-03-17 NOTE — Anesthesia Postprocedure Evaluation (Signed)
Anesthesia Post Note  Patient: Gloria Mills  Procedure(s) Performed: * No procedures listed *  Patient location during evaluation: Mother Baby Anesthesia Type: Epidural Level of consciousness: awake and alert and oriented Pain management: satisfactory to patient Vital Signs Assessment: post-procedure vital signs reviewed and stable Respiratory status: spontaneous breathing and nonlabored ventilation Cardiovascular status: stable Postop Assessment: no headache, no backache, no signs of nausea or vomiting, adequate PO intake and patient able to bend at knees (patient up walking) Anesthetic complications: no    Last Vitals:  Filed Vitals:   03/17/15 1310 03/17/15 1455  BP: 89/49 91/56  Pulse: 88   Temp: 37.4 C   Resp: 16     Last Pain:  Filed Vitals:   03/17/15 1456  PainSc: 0-No pain                 Prescilla Monger

## 2015-03-18 NOTE — Progress Notes (Signed)
Post Partum Day 1  Subjective:  Gloria Mills is a 30 y.o. G1P1001 [redacted]w[redacted]d s/p SVD with prolonged ROM and partial 3rd degree tear.  No acute events overnight.  Pt denies problems with ambulating, voiding or po intake.  She denies nausea or vomiting.  Pain is well controlled. Lochia Small.   Plan for birth control is oral progesterone-only contraceptive.   Method of Feeding: breast   Objective: BP 94/61 mmHg  Pulse 83  Temp(Src) 98.2 F (36.8 C) (Oral)  Resp 16  Ht  (1.626 m)  Wt 76.658 kg (169 lb)  BMI 28.99 kg/m2  SpO2 99%  LMP 06/11/2014  Breastfeeding? Unknown  Physical Exam:  General: alert, cooperative and no distress Lochia: normal flow Chest: CTAB Heart: RRR no m/r/g Abdomen: +BS, soft, nontender, fundus firm at umbilicus DVT Evaluation: No evidence of DVT seen on physical exam. Extremities: no edema   Recent Labs  03/16/15 0215  HGB 12.3  HCT 36.1    Assessment/Plan:  ASSESSMENT: Gloria Mills is a 30 y.o. G1P1001 [redacted]w[redacted]d PPD #0 s/p NSVD doing well.   Plan for discharge tomorrow   LOS: 2 days   Tonny Isensee 03/18/2015, 7:41 AM

## 2015-03-18 NOTE — Lactation Note (Signed)
This note was copied from the chart of Gloria Mills. Lactation Consultation Note  Patient Name: Gloria Idelle Reimann [id number] Date: 03/18/2015 Reason for consult: Follow-up assessment;Breast/nipple pain Mom reports baby having difficulty latching to left breast. She has noticed small blister today on left nipple. Mom is having pain with initial latch on both breasts. Positional stripe noted on nipples, no cracking or bleeding.  Assisted Mom with positioning to latch on left nipple. Baby very fussy, pulling on/off the nipple. Tried several positions and breast compression but baby became too fussy. Changed to right breast and baby was able to sustain the latch, demonstrated good suckling bursts, few swallowing motions noted. After about 5 minutes changed to left breast and baby latched with less difficulty, demonstrated to FOB how to help bring lip down for Mom. Both nipples have compression line when baby comes off the breast. Baby is noted to have short, anterior lingual frenulum. Care for sore nipples reviewed with Mom, advised to apply EBM. Comfort gels given with instructions. Mom had colostrum present with hand expression.  Advised Mom if baby fussy with latch on left breast, latch on right breast 1st then nurse on left breast till baby has less difficulty. Encouraged to call with next feeding for assist, Mom may need nipple shield if pain and nipple compression continues or gets worse. LC to follow up with next feeding.  Maternal Data    Feeding Feeding Type: Breast Fed Length of feed: 15 min  LATCH Score/Interventions Latch: Grasps breast easily, tongue down, lips flanged, rhythmical sucking. Intervention(s): Adjust position;Assist with latch;Breast massage;Breast compression  Audible Swallowing: A few with stimulation  Type of Nipple: Everted at rest and after stimulation  Comfort (Breast/Nipple): Filling, red/small blisters or bruises, mild/mod discomfort  Problem noted: Mild/Moderate  discomfort Interventions (Mild/moderate discomfort): Comfort gels;Hand massage;Hand expression  Hold (Positioning): Assistance needed to correctly position infant at breast and maintain latch. Intervention(s): Breastfeeding basics reviewed;Support Pillows;Position options;Skin to skin  LATCH Score: 7  Lactation Tools Discussed/Used Tools: Comfort gels   Consult Status Consult Status: Follow-up Date: 03/19/15 Follow-up type: In-patient    Alfred Levins 03/18/2015, 11:56 PM

## 2015-03-19 MED ORDER — NORETHINDRONE 0.35 MG PO TABS: 1.0000 | ORAL_TABLET | Freq: Every day | ORAL | Status: AC

## 2015-03-19 MED ORDER — SENNOSIDES-DOCUSATE SODIUM 8.6-50 MG PO TABS: 2.0000 | ORAL_TABLET | ORAL | Status: AC

## 2015-03-19 MED ORDER — ACETAMINOPHEN 325 MG PO TABS: 650.0000 mg | ORAL_TABLET | Freq: Four times a day (QID) | ORAL | Status: AC | PRN

## 2015-03-19 MED ORDER — OXYCODONE-ACETAMINOPHEN 5-325 MG PO TABS: 1.0000 | ORAL_TABLET | Freq: Four times a day (QID) | ORAL | Status: DC | PRN

## 2015-03-19 MED ORDER — IBUPROFEN 600 MG PO TABS: 600.0000 mg | ORAL_TABLET | Freq: Four times a day (QID) | ORAL | Status: AC

## 2015-03-19 MED ORDER — OXYCODONE-ACETAMINOPHEN 5-325 MG PO TABS: 1.0000 | ORAL_TABLET | Freq: Four times a day (QID) | ORAL | Status: AC | PRN

## 2015-03-19 NOTE — Progress Notes (Signed)
POSTPARTUM PROGRESS NOTE  Post Partum Day 2 Subjective:  Gloria Mills is a 30 y.o. G1P1001 [redacted]w[redacted]d s/p NSVD with prolonged ROM, GBS-, and partial 3rd degree laceration.  No acute events overnight.  Pt denies problems with ambulating, voiding or po intake.  She denies nausea or vomiting.  Pain is well controlled on ibuprofen. Lochia Minimal. Breastfeeding was initially painful but is going better. Has questions about what she can do to help heal the tear.   Objective: Blood pressure 100/60, pulse 71, temperature 97.8 F (36.6 C), temperature source Oral, resp. rate 18, height  (1.626 m), weight 76.658 kg (169 lb), last menstrual period 06/11/2014, SpO2 98 %, unknown if currently breastfeeding.  Physical Exam:  General: alert, cooperative, no distress, comfortably breastfeeding while sitting in bed Chest: CTAB Heart: RRR no m/r/g Abdomen: soft, nontender,  Uterine Fundus: firm, mildly tender at level of umbilicus Extremities: No edema, 2+ dorsalis pedis  No results for input(s): HGB, HCT in the last 72 hours.  Assessment/Plan:  ASSESSMENT: Gloria Mills is a 30 y.o. G1P1001 [redacted]w[redacted]d s/p NSVD with prolonged ROM, GBS-, and partial 3rd degree laceration who is doing well.  Discharge home Laceration healing - Encouraged use of stool softeners Breastfeeding Contraception - POPs   LOS: 3 days   Ann Maki 03/19/2015, 7:18 AM

## 2015-03-19 NOTE — Discharge Instructions (Signed)
Vaginal Laceration °A vaginal laceration is a tear in the vaginal wall. A vaginal tear falls into one of three categories:  °1. Obstetrically related tears that occur at the time of childbirth. °2. Trauma-related tears (most often related to sexual intercourse). °3. Spontaneous tears. °Vaginal tears can cause heavy bleeding (hemorrhaging) depending on severity of the tear. Tears can be intensely tender and interfere with normal activities of living. They can make sexual intercourse painful and bring on significant burning with urination. If you have a vaginal laceration, the area around your vagina may be painful when you touch or wipe it. Even light pressure from clothing may cause some pain. Vaginal tear need to be evaluated by your caregiver.   °CAUSES  °· Obstetric-related causes, such as childbirth. °· Trauma that may result from an accident during an activity, such as sexual intercourse or a bicycle ride. °· Spontaneous causes related to aging, failed healing of a past obstetric tear, chronic irritation, or skin changes that are not well understood. °SYMPTOMS  °· Slight to heavy vaginal bleeding. °· Vaginal swelling. °· Mild to severe pain. °· Vaginal tenderness. °DIAGNOSIS  °If the tear happened during childbirth, your caregiver can diagnose the tear at that time. To diagnose a vaginal tear that happened spontaneously or because of trauma, your caregiver will perform a physical exam. During the physical exam, your caregiver may also look for any signs of trouble that may need further testing. If there is hemorrhaging, your caregiver may suggest blood tests to determine the extent of bleeding. Imaging tests may be performed, such as an ultrasonography or computed tomography (CT), to look for internal damage. A biopsy may be need if there are signs of a more serious problem.  °TREATMENT  °Treatment depends on the severity of the tear. For minor tears that heal on their own, treatment may only consist of keeping  the area clean and dry. Some tears need to be repaired with stitches. Other tears may heal on their own with help from various remedies, such as antibiotic ointments, medicated creams, or petroleum products. Depending on the circumstances, oral hormones may also be suggested. Hormone remedies may also be in the form of topical creams and vaginal tablets. For more concerning situations, hospitalization and surgical repair of the tear may be needed. °HOME CARE INSTRUCTIONS  °· Take warm-water baths that cover your hips and buttocks (sitz bath) 2 to 3 times a day. This may help any discomfort and swelling.   °· Only take over-the-counter or prescription medicines for pain, discomfort, or fever as directed by your caregiver. Do not use aspirin because it can cause increased bleeding.   °· Do not douche, use tampons, or have intercourse until your caregiver says it is okay.    °· A bandage (dressing) may have been applied. Change the dressing once a day or as directed. If the dressing sticks, soak it off with warm, soapy water.   °· Apply ice or witch hazel pads to the vagina to lessen any pain or discomfort.   °· Take a stool softener or follow a special diet as directed by your caregiver. This will help ease discomfort associated with bowel movements.   °SEEK IMMEDIATE MEDICAL CARE IF:  °· You have redness or swelling in the vaginal area.   °· You have increasing, sharp, or intense pain or tenderness in the vaginal area. °· You have pus or unusual discharge coming from the tear or vagina.   °· You notice a bad smell coming from the vagina.   °· Your tear breaks open after   it healed or was repaired.   You feel lightheaded.  You have increasing abdominal pain.   You have an increasing or heavy amount of vaginal bleeding.   You have pain with intercourse after the tear heals.  MAKE SURE YOU:  Understand these instructions.  Will watch your condition.  Will get help right away if you are not doing well  or get worse.   This information is not intended to replace advice given to you by your health care provider. Make sure you discuss any questions you have with your health care provider.   Document Released: 01/27/2005 Document Revised: 10/22/2011 Document Reviewed: 06/16/2011 Elsevier Interactive Patient Education 2016 [employer] Vaginal Delivery, Care After Refer to this sheet in the next few weeks. These discharge instructions provide you with information on caring for yourself after delivery. Your health care provider may also give you specific instructions. Your treatment has been planned according to the most current medical practices available, but problems sometimes occur. Call your health care provider if you have any problems or questions after you go home. HOME CARE INSTRUCTIONS 4. Take over-the-counter or prescription medicines only as directed by your health care provider or pharmacist. 5. Do not drink alcohol, especially if you are breastfeeding or taking medicine to relieve pain. 6. Do not chew or smoke tobacco. 7. Do not use illegal drugs. 8. Continue to use good perineal care. Good perineal care includes: 1. Wiping your perineum from front to back. 2. Keeping your perineum clean. 9. Do not use tampons or douche until your health care provider says it is okay. 10. Shower, wash your hair, and take tub baths as directed by your health care provider. 11. Wear a well-fitting bra that provides breast support. 12. Eat healthy foods. 13. Drink enough fluids to keep your urine clear or pale yellow. 14. Eat high-fiber foods such as whole grain cereals and breads, brown rice, beans, and fresh fruits and vegetables every day. These foods may help prevent or relieve constipation. 15. Follow your health care provider's recommendations regarding resumption of activities such as climbing stairs, driving, lifting, exercising, or traveling. 16. Talk to your health care provider about  resuming sexual activities. Resumption of sexual activities is dependent upon your risk of infection, your rate of healing, and your comfort and desire to resume sexual activity. 17. Try to have someone help you with your household activities and your newborn for at least a few days after you leave the hospital. 18. Rest as much as possible. Try to rest or take a nap when your newborn is sleeping. 19. Increase your activities gradually. 20. Keep all of your scheduled postpartum appointments. It is very important to keep your scheduled follow-up appointments. At these appointments, your health care provider will be checking to make sure that you are healing physically and emotionally. SEEK MEDICAL CARE IF:   You are passing large clots from your vagina. Save any clots to show your health care provider.  You have a foul smelling discharge from your vagina.  You have trouble urinating.  You are urinating frequently.  You have pain when you urinate.  You have a change in your bowel movements.  You have increasing redness, pain, or swelling near your vaginal incision (episiotomy) or vaginal tear.  You have pus draining from your episiotomy or vaginal tear.  Your episiotomy or vaginal tear is separating.  You have painful, hard, or reddened breasts.  You have a severe headache.  You have blurred vision or  see spots.  You feel sad or depressed.  You have thoughts of hurting yourself or your newborn.  You have questions about your care, the care of your newborn, or medicines.  You are dizzy or light-headed.  You have a rash.  You have nausea or vomiting.  You were breastfeeding and have not had a menstrual period within 12 weeks after you stopped breastfeeding.  You are not breastfeeding and have not had a menstrual period by the 12th week after delivery.  You have a fever. SEEK IMMEDIATE MEDICAL CARE IF:   You have persistent pain.  You have chest pain.  You have  shortness of breath.  You faint.  You have leg pain.  You have stomach pain.  Your vaginal bleeding saturates two or more sanitary pads in 1 hour.   This information is not intended to replace advice given to you by your health care provider. Make sure you discuss any questions you have with your health care provider.   Document Released: 01/25/2000 Document Revised: 10/18/2014 Document Reviewed: 09/24/2011 Elsevier Interactive Patient Education [employer]

## 2015-03-19 NOTE — Lactation Note (Signed)
This note was copied from the chart of Gloria Mills. Lactation Consultation Note  Baby latched on R side.  Sucks and swallows observed. Mother states she is only breastfeeding on one breast per feeding but her nipple soreness has improved.   No positional stripe viewed on R side. Suggest she breastfeed on both breasts per feeding, burping in between. Provided mother w/ a manual pump. Reviewed engorgement care and monitoring voids/stools.   Patient Name: Gloria Mills [id number] Date: 03/19/2015 Reason for consult: Follow-up assessment   Maternal Data    Feeding Feeding Type: Breast Fed Length of feed: 15 min  LATCH Score/Interventions Latch: Grasps breast easily, tongue down, lips flanged, rhythmical sucking.  Audible Swallowing: A few with stimulation  Type of Nipple: Everted at rest and after stimulation  Comfort (Breast/Nipple): Filling, red/small blisters or bruises, mild/mod discomfort  Problem noted: Mild/Moderate discomfort Interventions (Mild/moderate discomfort): Comfort gels  Hold (Positioning): No assistance needed to correctly position infant at breast.  LATCH Score: 8  Lactation Tools Discussed/Used     Consult Status Consult Status: Complete    Hardie Pulley 03/19/2015, 10:49 AM

## 2015-03-19 NOTE — Discharge Summary (Signed)
OB Discharge Summary     Patient Name: Gloria Mills DOB: July 25, 1985 MRN: [id number]  Date of admission: 03/16/2015 Delivering MD: Cam Hai D   Date of discharge: 03/19/2015  Admitting diagnosis: 39 WEEKS ROM Intrauterine pregnancy: [redacted]w[redacted]d     Secondary diagnosis:  Active Problems:   PROM (premature rupture of membranes)   Third degree perineal laceration prolonged rupture of membranes  Additional problems: none     Discharge diagnosis: Term Pregnancy Delivered                                                                                                Post partum procedures:none  Augmentation: AROM, Pitocin, Cytotec and Foley Balloon  Complications: ROM>24 hours  Hospital course:  Onset of Labor With Vaginal Delivery     30 y.o. yo G1P1001 at [redacted]w[redacted]d was admitted in Latent Labor on 03/16/2015. Patient had an uncomplicated labor course as follows:  Membrane Rupture Time/Date: 12:30 AM ,03/16/2015   Intrapartum Procedures: Episiotomy: None [1]                                         Lacerations:  3rd degree [4]  Patient had a delivery of a Viable infant. 03/17/2015  Information for the patient's newborn:  Kinberly, Perris [[id number]]  Delivery Method: Vaginal, Spontaneous Delivery (Filed from Delivery Summary)    Pateint had an uncomplicated postpartum course.  She is ambulating, tolerating a regular diet, passing flatus, and urinating well. Patient is discharged home in stable condition on 03/19/2015.  Patient given appropriate counseling for partial 3rd degree and will d/c with sennakot and percocet.    Physical exam  Filed Vitals:   03/17/15 1820 03/18/15 0555 03/18/15 1728 03/19/15 0523  BP: 100/60  Pulse: 98 83 87 71  Temp: 98 F (36.7 C) 98.2 F (36.8 C) 97.9 F (36.6 C) 97.8 F (36.6 C)  TempSrc: Oral Oral Oral   Resp: Height:      Weight:      SpO2: 99%  98%    General: alert, cooperative and no distress Lochia:  appropriate Uterine Fundus: firm Incision: N/A DVT Evaluation: No cords or calf tenderness. No significant calf/ankle edema. Labs: Lab Results  Component Value Date   WBC 9.8 03/16/2015   HGB 12.3 03/16/2015   HCT 36.1 03/16/2015   MCV 90.7 03/16/2015   PLT 166 03/16/2015   No flowsheet data found.  Discharge instruction: per After Visit Summary and "Baby and Me Booklet".  After visit meds:    Medication List    TAKE these medications        acetaminophen 325 MG tablet  Commonly known as:  TYLENOL  Take 2 tablets (650 mg total) by mouth every 6 (six) hours as needed (for pain scale < 4).     ibuprofen 600 MG tablet  Commonly known as:  ADVIL,MOTRIN  Take 1 tablet (600 mg total) by mouth every 6 (six) hours.  norethindrone 0.35 MG tablet  Commonly known as:  MICRONOR,CAMILA,ERRIN  Take 1 tablet (0.35 mg total) by mouth daily.     oxyCODONE-acetaminophen 5-325 MG tablet  Commonly known as:  PERCOCET/ROXICET  Take 1 tablet by mouth every 6 (six) hours as needed.     prenatal multivitamin Tabs tablet  Take 1 tablet by mouth daily at 12 noon.     senna-docusate 8.6-50 MG tablet  Commonly known as:  Senokot-S  Take 2 tablets by mouth daily.        Diet: routine diet  Activity: Advance as tolerated. Pelvic rest for 6 weeks.   Outpatient follow up:6 weeks Follow up Appt:No future appointments. Follow up Visit:No Follow-up on file.  Postpartum contraception: Progesterone only pills  Newborn Data: Live born female  Birth Weight: 7 lb 3.3 oz (3269 g) APGAR: 8, 9  Baby Feeding: Breast Disposition:home with mother   03/19/2015 Silvano Bilis, MD

## 2015-03-20 NOTE — Progress Notes (Signed)
Post discharge chart review completed.  

## 2016-10-04 IMAGING — US US MFM FETAL NUCHAL TRANSLUCENCY
1 series · 13 of 28 positions shown · non-contrast
Comparison: none

[Series 1: us mfm fetal nuchal translucency · 13 of 66 slices shown]
[im 3/66]
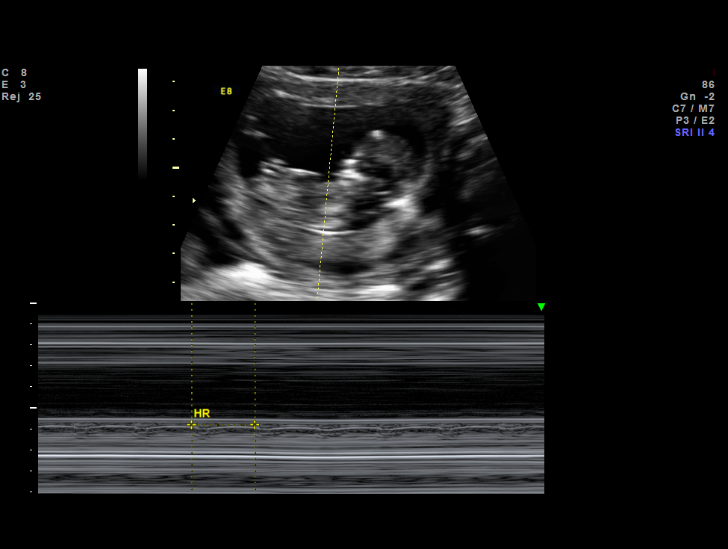
[im 8/66]
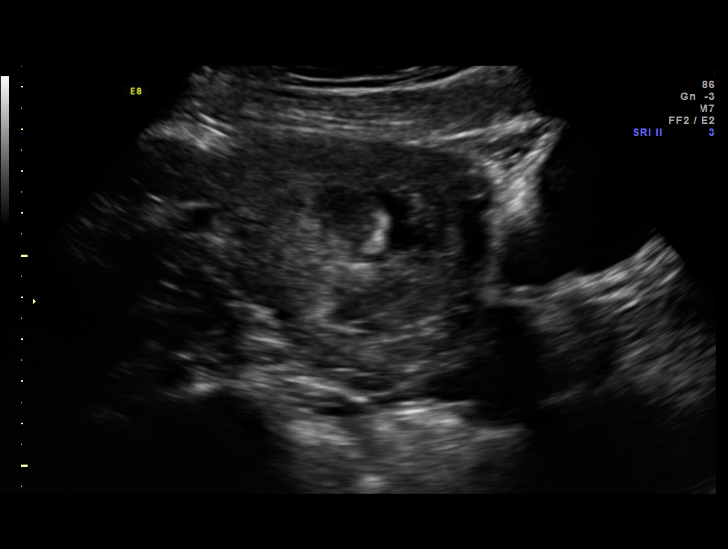
[im 13/66]
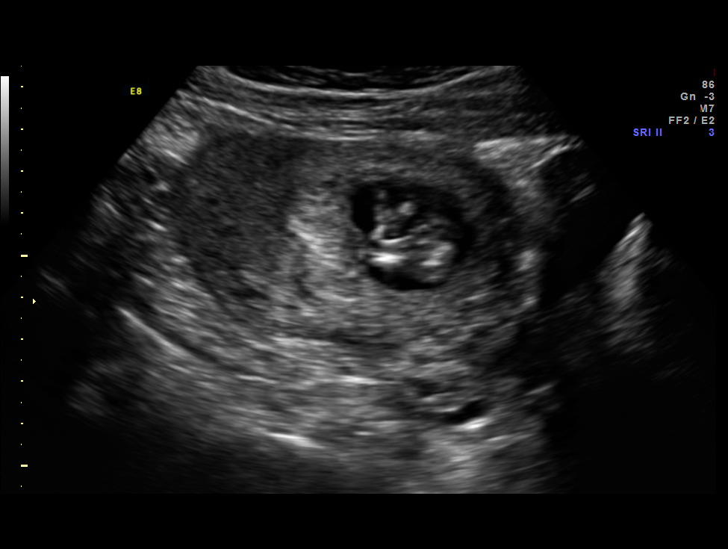
[im 17/66]
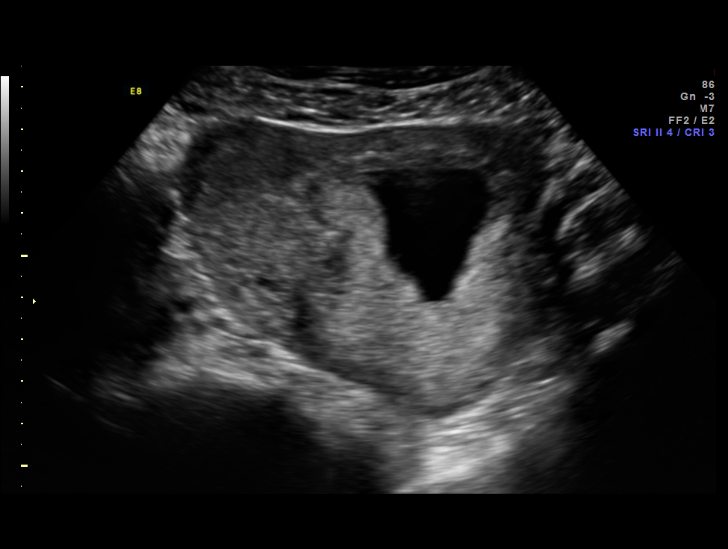
[im 22/66]
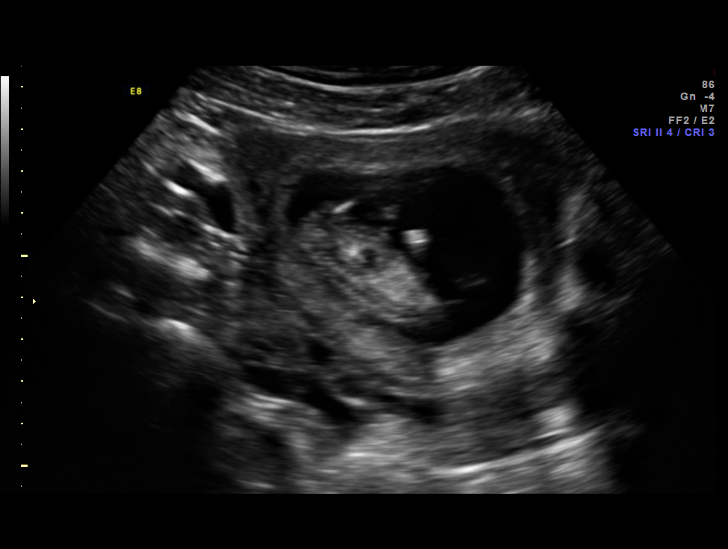
[im 27/66]
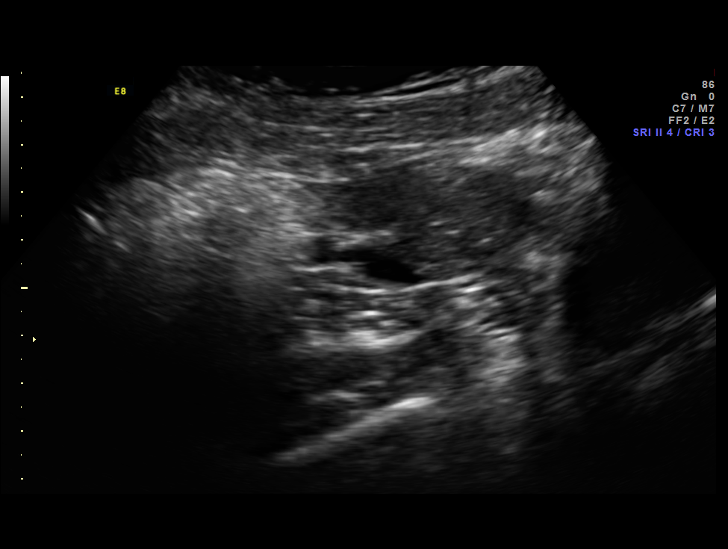
[im 34/66]
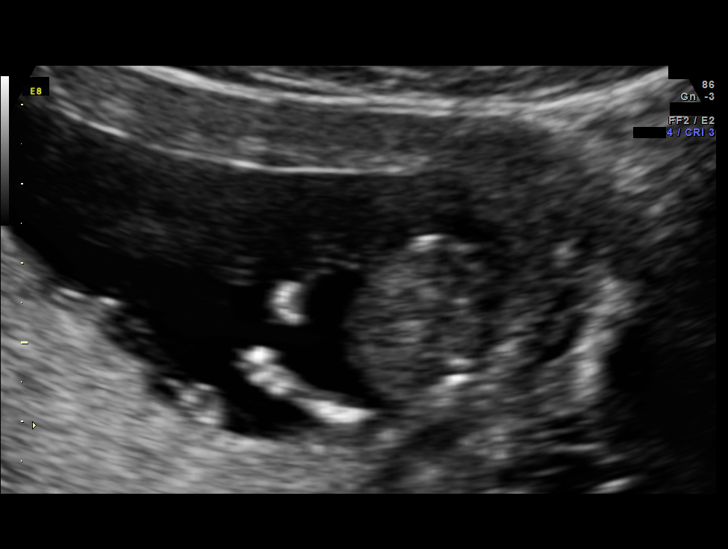
[im 39/66]
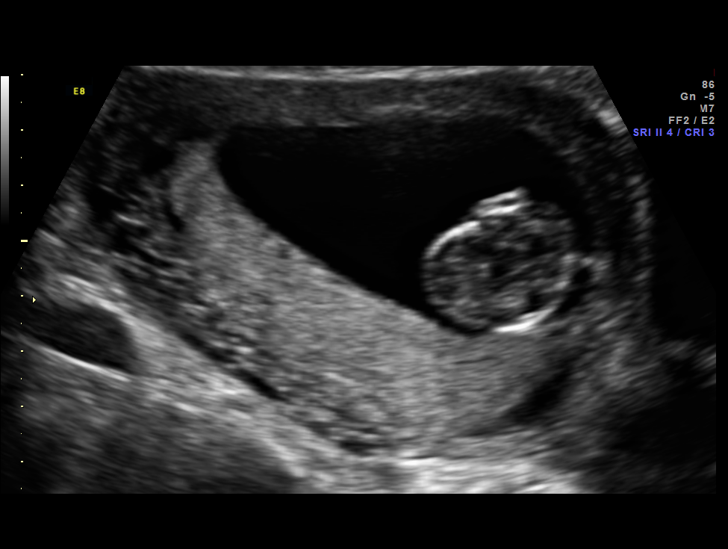
[im 44/66]
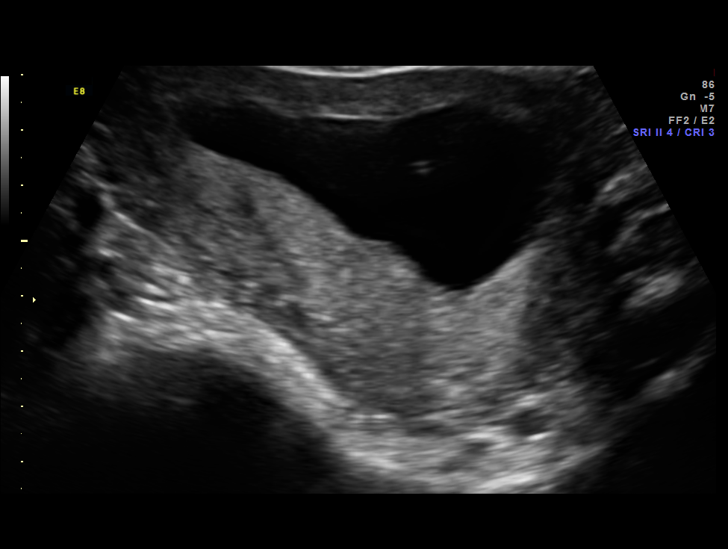
[im 49/66]
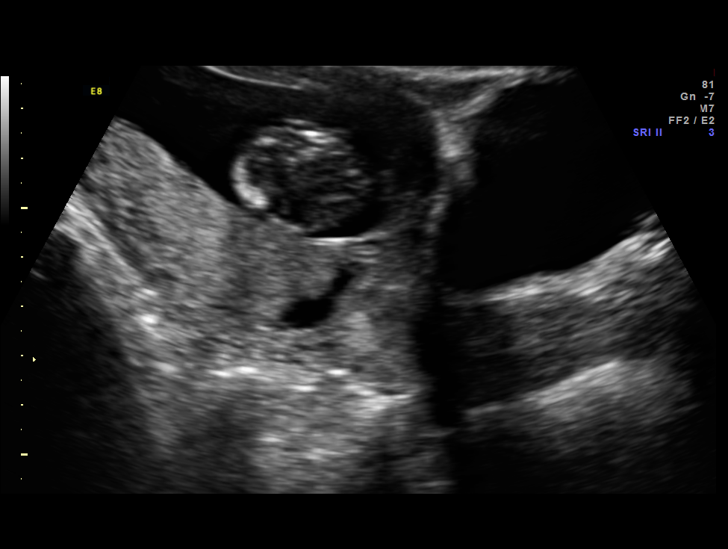
[im 53/66]
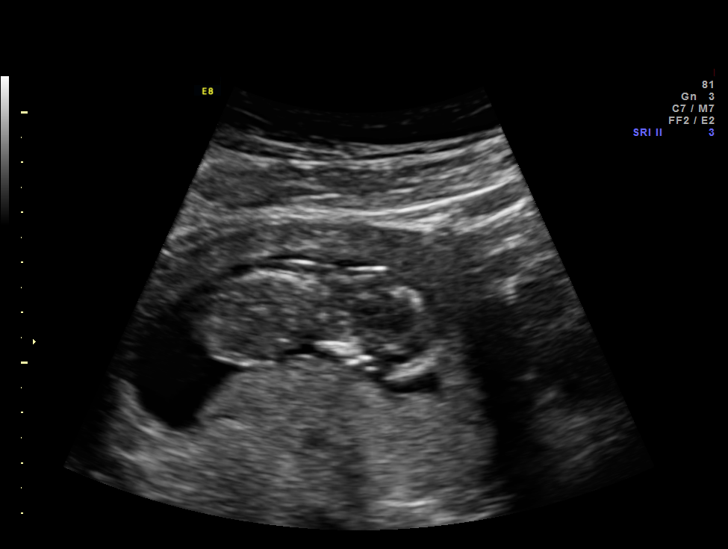
[im 58/66]
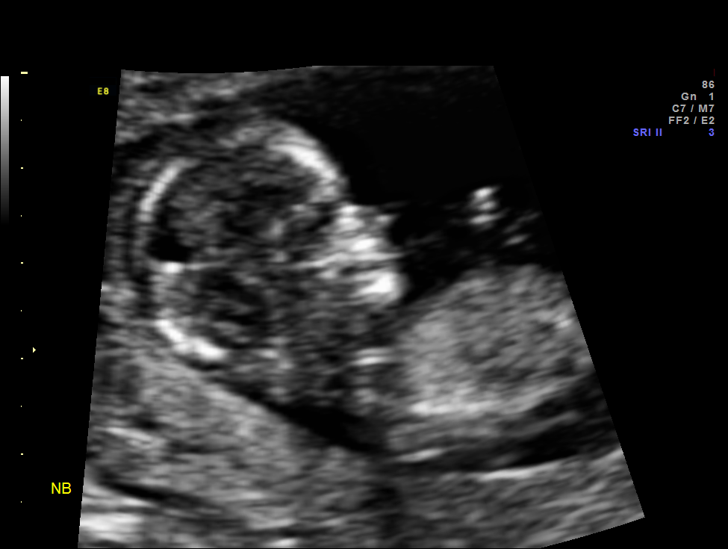
[im 63/66]
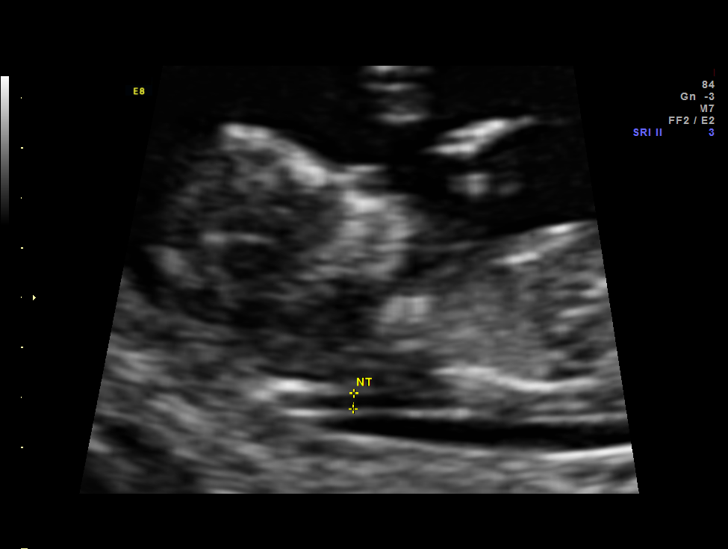

[13 of 28 positions shown; findings below may reference images not displayed]

OBSTETRICS REPORT
(Signed Final 09/12/2014 [DATE])

Service(s) Provided

Indications

First trimester aneuploidy screen (NT)                Z36
13 weeks gestation of pregnancy
Fetal Evaluation

Num Of Fetuses:    1
Fetal Heart Rate:  159                          bpm
Cardiac Activity:  Observed
Presentation:      Variable
Placenta:          Posterior
P. Cord            Visualized
Insertion:
Biometry

CRL:     68.2  mm     G. Age:  12w 6d                 EDD:    03/21/15
NT:       1.6  mm
Gestational Age

LMP:           13w 2d        Date:  06/11/14                 EDD:   03/18/15
Best:          13w 2d     Det. By:  LMP  (06/11/14)          EDD:   03/18/15
1st Trimester Genetic Sonogram Screening

CRL:            68.2  mm    G. Age:   12w 6d                 EDD:   03/21/15
Nuc Trans:       1.6  mm
Nasal Bone:                 Present
Anatomy

Choroid Plexus:   Appears normal         Bladder:          Appears normal
Stomach:          Appears normal, left
sided
Cervix Uterus Adnexa

Cervix:       Normal appearance by transabdominal scan.
Uterus:       No abnormality visualized.
Left Ovary:    Within normal limits.
Right Ovary:   Not visualized.
Adnexa:     No abnormality visualized. No adnexal mass visualized.
Impression

Single IUP at 13w 2d
Normal NT (1.6 mm)  Nasal bone visualized
First trimester aneuploidy screen performed as noted above.
Please do not draw triple/quad screen, though patient should
be offered [facility] for neural tube defect screening.
Recommendations

Recommend ultrasound for fetal anatomy at 18-20 weeks
gestation

questions or concerns.
